# Patient Record
Sex: Male | Born: 1980 | Race: White | Hispanic: No | Marital: Single | Smoking: Current every day smoker
Health system: Southern US, Community
[De-identification: ages and names within clinical notes are randomized; demographics above are authoritative.]

---

## 2005-03-07 ENCOUNTER — Emergency Department: Payer: Self-pay | Admitting: Emergency Medicine

## 2009-09-05 ENCOUNTER — Emergency Department: Payer: Self-pay | Admitting: Emergency Medicine

## 2010-10-20 ENCOUNTER — Emergency Department: Payer: Self-pay | Admitting: Emergency Medicine

## 2012-10-21 ENCOUNTER — Emergency Department: Payer: Self-pay | Admitting: Emergency Medicine

## 2013-12-14 ENCOUNTER — Ambulatory Visit: Payer: Self-pay | Admitting: Internal Medicine

## 2014-01-28 ENCOUNTER — Emergency Department: Payer: Self-pay | Admitting: Emergency Medicine

## 2014-01-28 LAB — URINALYSIS, COMPLETE
BILIRUBIN, UR: NEGATIVE
Bacteria: NONE SEEN
Blood: NEGATIVE
Glucose,UR: NEGATIVE mg/dL (ref 0–75)
Hyaline Cast: 1
Leukocyte Esterase: NEGATIVE
Nitrite: NEGATIVE
PH: 5 (ref 4.5–8.0)
Protein: NEGATIVE
Specific Gravity: 1.012 (ref 1.003–1.030)
Squamous Epithelial: <1

## 2014-01-28 LAB — COMPREHENSIVE METABOLIC PANEL
ALBUMIN: 4.3 g/dL (ref 3.4–5.0)
ANION GAP: 10 (ref 7–16)
Alkaline Phosphatase: 81 U/L
BUN: 12 mg/dL (ref 7–18)
Bilirubin,Total: 0.3 mg/dL (ref 0.2–1.0)
CREATININE: 1.23 mg/dL (ref 0.60–1.30)
Calcium, Total: 8.8 mg/dL (ref 8.5–10.1)
Chloride: 104 mmol/L (ref 98–107)
Co2: 23 mmol/L (ref 21–32)
EGFR (African American): >60
Glucose: 120 mg/dL — ABNORMAL HIGH (ref 65–99)
OSMOLALITY: 275 (ref 275–301)
POTASSIUM: 3.7 mmol/L (ref 3.5–5.1)
SGOT(AST): 32 U/L (ref 15–37)
SGPT (ALT): 23 U/L (ref 12–78)
Sodium: 137 mmol/L (ref 136–145)
Total Protein: 7.4 g/dL (ref 6.4–8.2)

## 2014-01-28 LAB — DRUG SCREEN, URINE

## 2014-01-28 LAB — ETHANOL
ETHANOL %: 0.01 % (ref 0.000–0.080)
Ethanol: 10 mg/dL

## 2014-01-28 LAB — SALICYLATE LEVEL: Salicylates, Serum: 2 mg/dL

## 2014-01-28 LAB — CBC
HCT: 44.1 % (ref 40.0–52.0)
HGB: 14.4 g/dL (ref 13.0–18.0)
MCH: 31.5 pg (ref 26.0–34.0)
MCHC: 32.8 g/dL (ref 32.0–36.0)
MCV: 96 fL (ref 80–100)
Platelet: 290 10*3/uL (ref 150–440)
RBC: 4.58 10*6/uL (ref 4.40–5.90)
RDW: 13.4 % (ref 11.5–14.5)
WBC: 13.1 10*3/uL — ABNORMAL HIGH (ref 3.8–10.6)

## 2014-01-28 LAB — ACETAMINOPHEN LEVEL

## 2015-04-17 NOTE — Consult Note (Signed)
PATIENT NAME:  Jerome Hudson, Jerome Hudson MR#:  [id number] DATE OF BIRTH:  07-27-1981  DATE OF CONSULTATION:  01/29/2014  REFERRING PHYSICIAN:  Janalyn Harder, MD   CONSULTING PHYSICIAN:  Ardeen Fillers. Garnetta Buddy, MD  REASON FOR CONSULTATION:  "I want to go home".   HISTORY OF PRESENT ILLNESS:  The patient is a 34 year old Caucasian male who presented to the ED by the EMS as he was unresponsive at home and he drank  rubbing alcohol. He reported that he was having a conflict with his girlfriend and he was at his parents' home. He broke up with his girlfriend yesterday. He stated that he was very upset and he does not remember the events which led to his admission. He stated that he was not trying to kill himself. He reported that he was having this  relationship off and on for the past 3 years. He stated that he was having arguments with his girlfriend as he was not working and he got fired from his job recently. He was staying home most of the time and was trying to work from home. She is currently working. The patient stated that he was living with her, but just came to his parents' home yesterday. The patient reported that he thinks that the man should work. He was usually arguing with her after she will come back from work. He was angry with her and then started arguing. She would not argue with him. The patient stated that she has just called him and told him that she loves him. The patient stated that he was not trying to hurt himself, but he was trying to drink the rubbing alcohol as he was very upset and agitated. He currently denied having any suicidal ideations or plans.   PAST PSYCHIATRIC HISTORY:  The patient reported that he does not use drugs or alcohol. He reported that he has no previous psychiatric admission. He does not feel depressed, anxious, and angry at this time.   PAST MEDICAL HISTORY:  He reported that he does not have any medical issues.   ALLERGIES:  No known drug allergies.   PSYCHIATRIC ILLNESS  IN THE FAMILY:  The patient reported there is no history of psychiatric illness in his family members. There is no history of suicide in his family.   SOCIAL HISTORY:  The patient reported that he has a 98-year-old child from his previous relationship. He has a current relationship for the past 3 years. He reported that they live together. His parents live close by. He denied having any pending legal charges.   CURRENT MEDICATIONS:  None.   MEDICAL HISTORY:  A history of genital herpes. A history of a femur fracture repair on the left side. A history of back surgery.   ANCILLARY DATA:  Temperature 98.1, pulse 96, respirations 20, blood pressure 112/54.  LABORATORY, DIAGNOSTIC, AND RADIOLOGICAL DATA:  Glucose 120, BUN 12, creatinine 1.23, sodium 137, potassium 3.7, chloride 104, bicarbonate 23, anion gap 10, osmolality 275, calcium 8.81. Blood alcohol level 10. Protein 7.4, albumin 4.3, bilirubin 0.37, alkaline phosphatase 81, AST 32, ALT 20. UDS negative. WBC 13.1, RBC 4.58, hemoglobin 14.4, MCV 96, RDW 13.4.  REVIEW OF SYSTEMS: CONSTITUTIONAL:  Denies any fever or chills. No weight changes.  EYES:  No double or blurred vision.  RESPIRATORY:  No shortness of breath or cough.  CARDIOVASCULAR:  No chest pain or orthopnea.  GASTROINTESTINAL:  No abdominal pain, nausea, vomiting or diarrhea.  GENITOURINARY:  No incontinence or frequency.  ENDOCRINE:  No heat or cold intolerance.  LYMPHATIC:  No anemia or easy bruising.  INTEGUMENTARY:  No acne or rash.  MUSCULOSKELETAL:  Denies any muscle or joint pain.  NEUROLOGIC:  No tingling or weakness.   MENTAL STATUS EXAMINATION:  The patient is a moderately built male who was lying in the bed. He was awake, alert, oriented to time, place and person. Attention span normal. Concentration within normal limits. Recent and remote memory intact. Language was normal. Fund of knowledge appeared appropriate. Speech was normal. Mood was fine. Affect was congruent.  Thought process was logical, goal-directed. Thought content was nondelusional. No loosening of associations were noted. Thought content:  Did not have any suicidal or homicidal ideation or plans. Demonstrated fair insight and judgment.    DIAGNOSTIC IMPRESSION:  AXIS I: Mood disorder.  AXIS II: None noted.  AXIS III: Denied having any medical problems.   TREATMENT PLAN:  1.  Discussed with the patient about the medications, treatment, risks, benefits and alternatives.  2.  I will discontinue the involuntary commitment as the patient contracted for safety and does not have any thoughts to harm himself. He will be discharged in a stable condition. No medications dispensed at this time.   ____________________________ Ardeen FillersUzma S. Garnetta BuddyFaheem, MD usf:jm D: 01/29/2014 15:20:36 ET T: 01/29/2014 15:51:32 ET JOB#: [id number]  cc: Ardeen FillersUzma S. Garnetta BuddyFaheem, MD, <Dictator> Rhunette CroftUZMA S Mancil Pfenning MD ELECTRONICALLY SIGNED 02/09/2014 9:03

## 2020-07-07 ENCOUNTER — Emergency Department: Payer: Self-pay

## 2020-07-07 ENCOUNTER — Emergency Department
Admission: EM | Admit: 2020-07-07 | Discharge: 2020-07-07 | Disposition: A | Discharge status: Home / Self Care | Payer: Self-pay | Attending: Emergency Medicine | Admitting: Emergency Medicine

## 2020-07-07 DIAGNOSIS — T50901A Poisoning by unspecified drugs, medicaments and biological substances, accidental (unintentional), initial encounter: Secondary | ICD-10-CM | POA: Insufficient documentation

## 2020-07-07 LAB — CBC WITH DIFFERENTIAL/PLATELET
Abs Immature Granulocytes: 0.05 10*3/uL (ref 0.00–0.07)
Basophils Absolute: 0.1 10*3/uL (ref 0.0–0.1)
Basophils Relative: 1 %
Eosinophils Absolute: 0.1 10*3/uL (ref 0.0–0.5)
Eosinophils Relative: 1 %
HCT: 43.6 % (ref 39.0–52.0)
Hemoglobin: 14.5 g/dL (ref 13.0–17.0)
Immature Granulocytes: 1 %
Lymphocytes Relative: 10 %
Lymphs Abs: 0.9 10*3/uL (ref 0.7–4.0)
MCH: 30.5 pg (ref 26.0–34.0)
MCHC: 33.3 g/dL (ref 30.0–36.0)
MCV: 91.8 fL (ref 80.0–100.0)
Monocytes Absolute: 0.8 10*3/uL (ref 0.1–1.0)
Monocytes Relative: 9 %
Neutro Abs: 7 10*3/uL (ref 1.7–7.7)
Neutrophils Relative %: 78 %
Platelets: 300 10*3/uL (ref 150–400)
RBC: 4.75 MIL/uL (ref 4.22–5.81)
RDW: 12.9 % (ref 11.5–15.5)
WBC: 8.9 10*3/uL (ref 4.0–10.5)
nRBC: 0 % (ref 0.0–0.2)

## 2020-07-07 LAB — COMPREHENSIVE METABOLIC PANEL
ALT: 78 U/L — ABNORMAL HIGH (ref 0–44)
AST: 58 U/L — ABNORMAL HIGH (ref 15–41)
Albumin: 4.2 g/dL (ref 3.5–5.0)
Alkaline Phosphatase: 56 U/L (ref 38–126)
Anion gap: 11 (ref 5–15)
BUN: 8 mg/dL (ref 6–20)
CO2: 25 mmol/L (ref 22–32)
Calcium: 8.4 mg/dL — ABNORMAL LOW (ref 8.9–10.3)
Chloride: 105 mmol/L (ref 98–111)
Creatinine, Ser: 1.19 mg/dL (ref 0.61–1.24)
GFR calc Af Amer: >60 mL/min (ref >60)
GFR calc non Af Amer: >60 mL/min (ref >60)
Glucose, Bld: 80 mg/dL (ref 70–99)
Potassium: 3.7 mmol/L (ref 3.5–5.1)
Sodium: 141 mmol/L (ref 135–145)
Total Bilirubin: 0.6 mg/dL (ref 0.3–1.2)
Total Protein: 7.3 g/dL (ref 6.5–8.1)

## 2020-07-07 LAB — ETHANOL: Alcohol, Ethyl (B): 20 mg/dL — ABNORMAL HIGH (ref <10)

## 2020-07-07 LAB — SALICYLATE LEVEL: Salicylate Lvl: <7 mg/dL — ABNORMAL LOW (ref 7.0–30.0)

## 2020-07-07 LAB — ACETAMINOPHEN LEVEL: Acetaminophen (Tylenol), Serum: <10 ug/mL — ABNORMAL LOW (ref 10–30)

## 2020-07-07 MED ORDER — SODIUM CHLORIDE 0.9 % IV BOLUS
1000.0000 mL | Freq: Once | INTRAVENOUS | Status: AC
  Administered 2020-07-07: 1000 mL via INTRAVENOUS

## 2020-07-07 MED ORDER — LORAZEPAM 2 MG/ML IJ SOLN
1.0000 mg | Freq: Once | INTRAMUSCULAR | Status: AC
  Administered 2020-07-07: 1 mg via INTRAVENOUS
  Filled 2020-07-07: qty 1

## 2020-07-07 MED ORDER — HALOPERIDOL LACTATE 5 MG/ML IJ SOLN
5.0000 mg | Freq: Once | INTRAMUSCULAR | Status: DC
  Filled 2020-07-07: qty 1

## 2020-07-07 MED ORDER — LORAZEPAM 2 MG/ML IJ SOLN
2.0000 mg | Freq: Once | INTRAMUSCULAR | Status: DC
  Filled 2020-07-07: qty 1

## 2020-07-07 MED ORDER — DIPHENHYDRAMINE HCL 50 MG/ML IJ SOLN
25.0000 mg | Freq: Once | INTRAMUSCULAR | Status: DC
  Filled 2020-07-07: qty 1
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
## 2020-07-07 NOTE — ED Notes (Signed)
Pt visualized in NAD at this time, resting in bed with eyes closed, respirations even and unlabored. VSS. Lights remain dimmed for patient comfort.
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
## 2020-07-07 NOTE — ED Notes (Signed)
Pt sitting on edge of bed but continues to refuse care at this time.

## 2020-07-07 NOTE — ED Notes (Addendum)
Multiple staff and Health visitor to bedside to update patient on procedure. Pt willing to have blood draw for legal blood draw after being read his rights.   Right hand cleaned with betadine and allowed to dry. Tourniquet applied and blood sample sample drawn with blood draw needle. Tubes were filled completely and not shaken. Given then to the [employer] officer and RN observed them being sealed.

## 2020-07-07 NOTE — ED Notes (Signed)
Pt continues to attempt to call for safe ride at this time.

## 2020-07-07 NOTE — ED Notes (Signed)
Pt resting in bed laying on L side at this time. VSS. Lights remain dimmed for patient comfort.

## 2020-07-07 NOTE — ED Notes (Signed)
NAD noted at time of D/C. Pt denies questions or concerns. Pt ambulatory to the lobby at this time. EDP aware of patient's HR at time of D/C. Pt asymptomatic, per EDP pt okay for D/C. This RN notified by front desk staff pt's ride arrived to pick patient up.

## 2020-07-07 NOTE — ED Notes (Signed)
Pt sitting up in bed drinking water, attempting to call for ride, provided with urinal by this RN.

## 2020-07-07 NOTE — ED Provider Notes (Signed)
Patient is in no acute distress, is cleared for outpatient follow-up.   Emily Filbert, MD 07/07/20 1059

## 2020-07-07 NOTE — ED Notes (Signed)
After talking with [employer] and this RN pt has become more agreeable. Tearful about loss of mother and getting into trouble tonight but agrees to allow staff to perform all tasks needed to get through the night to see the psychiatrist in the morning.
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
## 2020-07-07 NOTE — ED Notes (Signed)
Pt continues to rest in bed with NAD noted. VSS at this time. Lights remain dimmed for patient comfort.

## 2020-07-07 NOTE — ED Notes (Signed)
This RN to bedside, pt noted to be standing at end of bed. This RN assisted patient back to bed. Pt given water and phone to call for safe ride at this time.

## 2020-07-07 NOTE — ED Notes (Signed)
Pt continues resting in bed. NAD at this time.  

## 2020-07-07 NOTE — ED Notes (Signed)
RN to bedside attempting to convince patient to allow vital signs and IV. Pt continues to adamantly refuse and states, I just want to get the fuck out of here"   RN has confirmed IVC papers are finalized and security sitting outside of patient's room but pt is sleeping in bed at this time.

## 2020-07-07 NOTE — ED Notes (Signed)
Pt continues sleeping soundly. NAD noted at this time.

## 2020-07-07 NOTE — ED Notes (Signed)
Only emergency contact listed in chart was patients mother who patient reports passed away in 2023/03/06. of this year. When asked about her pt burst into tears and continued to ask for [facility].

## 2020-11-12 ENCOUNTER — Ambulatory Visit: Payer: Self-pay | Admitting: Physician Assistant

## 2020-11-12 ENCOUNTER — Other Ambulatory Visit: Payer: Self-pay

## 2020-11-12 DIAGNOSIS — Z202 Contact with and (suspected) exposure to infections with a predominantly sexual mode of transmission: Secondary | ICD-10-CM

## 2020-11-12 DIAGNOSIS — Z113 Encounter for screening for infections with a predominantly sexual mode of transmission: Secondary | ICD-10-CM

## 2020-11-12 MED ORDER — DOXYCYCLINE HYCLATE 100 MG PO TABS: 100.0000 mg | ORAL_TABLET | Freq: Two times a day (BID) | ORAL | 0 refills | Status: DC

## 2020-11-13 ENCOUNTER — Encounter: Payer: Self-pay | Admitting: Physician Assistant

## 2020-11-13 NOTE — Progress Notes (Signed)
   [facility] Department STI clinic/screening visit  Subjective:  Jerome Hudson is a 39 y.o. male being seen today for an STI screening visit. The patient reports they do not have symptoms.    Patient has the following medical conditions:  There are no problems to display for this patient.    Chief Complaint  Patient presents with  . SEXUALLY TRANSMITTED DISEASE    screening    HPI  Patient reports that he is not having any symptoms but is a contact to Chlamydia.  Denies chronic conditions and regular medicines.  States last HIV test was 1 year ago.   See flowsheet for further details and programmatic requirements.    The following portions of the patient's history were reviewed and updated as appropriate: allergies, current medications, past medical history, past social history, past surgical history and problem list.  Objective:  There were no vitals filed for this visit.  Physical Exam Constitutional:      General: He is not in acute distress.    Appearance: Normal appearance.  HENT:     Head: Normocephalic and atraumatic.  Eyes:     Conjunctiva/sclera: Conjunctivae normal.  Pulmonary:     Effort: Pulmonary effort is normal.  Skin:    General: Skin is warm and dry.  Neurological:     Mental Status: He is alert and oriented to person, place, and time.  Psychiatric:        Mood and Affect: Mood normal.        Behavior: Behavior normal.        Thought Content: Thought content normal.        Judgment: Judgment normal.       Assessment and Plan:  Jerome Hudson is a 39 y.o. male presenting to the [facility] Department for STI screening  1. Screening for STD (sexually transmitted disease) Patient into clinic without symptoms. Patient declines screening exam and testing.  Requests blood work and treatment only.  Rec condoms with all sex. Await test results.  Counseled that RN will call if needs to RTC for treatment once results are  back. - HBV Antigen/Antibody [facility] - HIV/HCV [place] [facility] - Syphilis Serology, [facility]  2. Chlamydia contact Will treat as a contact to Chlamydia with Doxycycline 100 mg #28 1 po BID for 14 days.   Counseled patient that due to packaging, will give him 14 days of medicine, but he will only need to take the medicine for 7 days. No sex for 14 days and until after partner completes treatment . Call with questions or concerns. - doxycycline (VIBRA-TABS) 100 MG tablet; Take 1 tablet (100 mg total) by mouth 2 (two) times daily.  Dispense: 14 tablet; Refill: 0     No follow-ups on file.  No future appointments.  Matt Holmes, PA

## 2020-11-21 NOTE — Progress Notes (Signed)
Chart reviewed by Pharmacist  Suzanne Walker PharmD, Contract Pharmacist at [facility]  

## 2020-11-24 LAB — HEPATITIS B SURFACE ANTIGEN: Hepatitis B Surface Ag: NEGATIVE

## 2020-12-09 ENCOUNTER — Telehealth: Payer: Self-pay

## 2020-12-09 DIAGNOSIS — K732 Chronic active hepatitis, not elsewhere classified: Secondary | ICD-10-CM | POA: Insufficient documentation

## 2021-01-20 ENCOUNTER — Other Ambulatory Visit: Payer: Self-pay

## 2021-01-20 ENCOUNTER — Encounter: Payer: Self-pay | Admitting: Emergency Medicine

## 2021-01-20 ENCOUNTER — Emergency Department: Payer: Managed Care, Other (non HMO)

## 2021-01-20 ENCOUNTER — Emergency Department
Admission: EM | Admit: 2021-01-20 | Discharge: 2021-01-20 | Disposition: A | Discharge status: Home / Self Care | Payer: Managed Care, Other (non HMO) | Attending: Physician Assistant | Admitting: Physician Assistant

## 2021-01-20 DIAGNOSIS — K920 Hematemesis: Secondary | ICD-10-CM | POA: Insufficient documentation

## 2021-01-20 DIAGNOSIS — Z5321 Procedure and treatment not carried out due to patient leaving prior to being seen by health care provider: Secondary | ICD-10-CM | POA: Diagnosis not present

## 2021-01-20 DIAGNOSIS — R1011 Right upper quadrant pain: Secondary | ICD-10-CM | POA: Diagnosis not present

## 2021-01-20 LAB — CBC WITH DIFFERENTIAL/PLATELET
Abs Immature Granulocytes: 0.02 10*3/uL (ref 0.00–0.07)
Basophils Absolute: 0.1 10*3/uL (ref 0.0–0.1)
Basophils Relative: 1 %
Eosinophils Absolute: 0.2 10*3/uL (ref 0.0–0.5)
Eosinophils Relative: 3 %
HCT: 45.4 % (ref 39.0–52.0)
Hemoglobin: 15.7 g/dL (ref 13.0–17.0)
Immature Granulocytes: 0 %
Lymphocytes Relative: 30 %
Lymphs Abs: 2.3 10*3/uL (ref 0.7–4.0)
MCH: 32.4 pg (ref 26.0–34.0)
MCHC: 34.6 g/dL (ref 30.0–36.0)
MCV: 93.6 fL (ref 80.0–100.0)
Monocytes Absolute: 0.8 10*3/uL (ref 0.1–1.0)
Monocytes Relative: 10 %
Neutro Abs: 4.3 10*3/uL (ref 1.7–7.7)
Neutrophils Relative %: 56 %
Platelets: 347 10*3/uL (ref 150–400)
RBC: 4.85 MIL/uL (ref 4.22–5.81)
RDW: 12.7 % (ref 11.5–15.5)
WBC: 7.7 10*3/uL (ref 4.0–10.5)
nRBC: 0 % (ref 0.0–0.2)

## 2021-01-20 LAB — COMPREHENSIVE METABOLIC PANEL
ALT: 105 U/L — ABNORMAL HIGH (ref 0–44)
AST: 43 U/L — ABNORMAL HIGH (ref 15–41)
Albumin: 4.3 g/dL (ref 3.5–5.0)
Alkaline Phosphatase: 65 U/L (ref 38–126)
Anion gap: 9 (ref 5–15)
BUN: 12 mg/dL (ref 6–20)
CO2: 24 mmol/L (ref 22–32)
Calcium: 9 mg/dL (ref 8.9–10.3)
Chloride: 109 mmol/L (ref 98–111)
Creatinine, Ser: 1.02 mg/dL (ref 0.61–1.24)
GFR, Estimated: >60 mL/min (ref >60)
Glucose, Bld: 114 mg/dL — ABNORMAL HIGH (ref 70–99)
Potassium: 4 mmol/L (ref 3.5–5.1)
Sodium: 142 mmol/L (ref 135–145)
Total Bilirubin: 0.5 mg/dL (ref 0.3–1.2)
Total Protein: 7.5 g/dL (ref 6.5–8.1)

## 2021-01-20 LAB — LIPASE, BLOOD: Lipase: 45 U/L (ref 11–51)

## 2021-01-20 NOTE — ED Notes (Signed)
Called 2n time no answer

## 2021-01-20 NOTE — ED Notes (Signed)
Called 1X with no answer  

## 2021-01-20 NOTE — ED Provider Notes (Signed)
MSE was initiated and I personally evaluated the patient and placed orders (if any) at  1:25 PM on January 20, 2021.  The patient appears stable so that the remainder of the MSE may be completed by another provider.  Hx meth use, vomiting and diarrhea, red sreaks of blood in vomit, some ruq pain, still has gb  Labs and [place] ordered   Faythe Ghee, PA-C 01/20/21 1326    Willy Eddy, MD 01/20/21 1329

## 2021-01-20 NOTE — ED Notes (Signed)
Called no answer

## 2021-01-20 NOTE — ED Triage Notes (Signed)
Pt with hematemesis that started yesterday. Pt reports emesis x 3 today with strands of blood.

## 2021-02-24 NOTE — Telephone Encounter (Signed)
Patient info faxed to [facility], RN

## 2021-06-26 ENCOUNTER — Other Ambulatory Visit: Payer: Self-pay

## 2021-06-26 ENCOUNTER — Emergency Department: Payer: Medicaid Other

## 2021-06-26 ENCOUNTER — Emergency Department
Admission: EM | Admit: 2021-06-26 | Discharge: 2021-06-27 | Disposition: A | Discharge status: Home / Self Care | Payer: Medicaid Other | Attending: Emergency Medicine | Admitting: Emergency Medicine

## 2021-06-26 ENCOUNTER — Encounter: Payer: Self-pay | Admitting: Emergency Medicine

## 2021-06-26 DIAGNOSIS — R410 Disorientation, unspecified: Secondary | ICD-10-CM | POA: Insufficient documentation

## 2021-06-26 DIAGNOSIS — F17210 Nicotine dependence, cigarettes, uncomplicated: Secondary | ICD-10-CM | POA: Insufficient documentation

## 2021-06-26 DIAGNOSIS — Y901 Blood alcohol level of 20-39 mg/100 ml: Secondary | ICD-10-CM | POA: Insufficient documentation

## 2021-06-26 DIAGNOSIS — F10129 Alcohol abuse with intoxication, unspecified: Secondary | ICD-10-CM | POA: Insufficient documentation

## 2021-06-26 DIAGNOSIS — R451 Restlessness and agitation: Secondary | ICD-10-CM

## 2021-06-26 LAB — BLOOD GAS, VENOUS
Acid-Base Excess: 0.2 mmol/L (ref 0.0–2.0)
Bicarbonate: 27.7 mmol/L (ref 20.0–28.0)
O2 Saturation: 87.5 %
Patient temperature: 37
pCO2, Ven: 55 mmHg (ref 44.0–60.0)
pH, Ven: 7.31 (ref 7.250–7.430)
pO2, Ven: 59 mmHg — ABNORMAL HIGH (ref 32.0–45.0)

## 2021-06-26 LAB — CBC WITH DIFFERENTIAL/PLATELET
Abs Immature Granulocytes: 0.01 10*3/uL (ref 0.00–0.07)
Basophils Absolute: 0.1 10*3/uL (ref 0.0–0.1)
Basophils Relative: 1 %
Eosinophils Absolute: 0.2 10*3/uL (ref 0.0–0.5)
Eosinophils Relative: 3 %
HCT: 46.1 % (ref 39.0–52.0)
Hemoglobin: 15.6 g/dL (ref 13.0–17.0)
Immature Granulocytes: 0 %
Lymphocytes Relative: 40 %
Lymphs Abs: 3.1 10*3/uL (ref 0.7–4.0)
MCH: 31.3 pg (ref 26.0–34.0)
MCHC: 33.8 g/dL (ref 30.0–36.0)
MCV: 92.4 fL (ref 80.0–100.0)
Monocytes Absolute: 0.7 10*3/uL (ref 0.1–1.0)
Monocytes Relative: 10 %
Neutro Abs: 3.6 10*3/uL (ref 1.7–7.7)
Neutrophils Relative %: 46 %
Platelets: 322 10*3/uL (ref 150–400)
RBC: 4.99 MIL/uL (ref 4.22–5.81)
RDW: 12.6 % (ref 11.5–15.5)
WBC: 7.8 10*3/uL (ref 4.0–10.5)
nRBC: 0 % (ref 0.0–0.2)

## 2021-06-26 LAB — SALICYLATE LEVEL: Salicylate Lvl: <7 mg/dL — ABNORMAL LOW (ref 7.0–30.0)

## 2021-06-26 LAB — ETHANOL: Alcohol, Ethyl (B): 35 mg/dL — ABNORMAL HIGH (ref <10)

## 2021-06-26 LAB — COMPREHENSIVE METABOLIC PANEL
ALT: 83 U/L — ABNORMAL HIGH (ref 0–44)
AST: 46 U/L — ABNORMAL HIGH (ref 15–41)
Albumin: 4.3 g/dL (ref 3.5–5.0)
Alkaline Phosphatase: 64 U/L (ref 38–126)
Anion gap: 11 (ref 5–15)
BUN: 12 mg/dL (ref 6–20)
CO2: 24 mmol/L (ref 22–32)
Calcium: 8.9 mg/dL (ref 8.9–10.3)
Chloride: 108 mmol/L (ref 98–111)
Creatinine, Ser: 1.16 mg/dL (ref 0.61–1.24)
GFR, Estimated: >60 mL/min (ref >60)
Glucose, Bld: 97 mg/dL (ref 70–99)
Potassium: 3.4 mmol/L — ABNORMAL LOW (ref 3.5–5.1)
Sodium: 143 mmol/L (ref 135–145)
Total Bilirubin: 0.6 mg/dL (ref 0.3–1.2)
Total Protein: 7.6 g/dL (ref 6.5–8.1)

## 2021-06-26 LAB — AMMONIA: Ammonia: 35 umol/L (ref 9–35)

## 2021-06-26 LAB — ACETAMINOPHEN LEVEL: Acetaminophen (Tylenol), Serum: <10 ug/mL — ABNORMAL LOW (ref 10–30)

## 2021-06-26 MED ORDER — SODIUM CHLORIDE 0.9 % IV BOLUS
1000.0000 mL | Freq: Once | INTRAVENOUS | Status: AC
  Administered 2021-06-26: 20:00:00 1000 mL via INTRAVENOUS
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
## 2021-06-26 NOTE — ED Provider Notes (Signed)
[facility] Emergency Department Provider Note  ____________________________________________   Event Date/Time   First MD Initiated Contact with Patient 06/26/21 1933     (approximate)  I have reviewed the triage vital signs and the nursing notes.   HISTORY  Chief Complaint Altered Mental Status and Alcohol Intoxication    HPI Jerome Hudson is a 40 y.o. male with chronic hepatitis here with altered mental status.  The patient arrives via EMS.  Per report, he was found in the alcohol aisle at [employer].  He was initially minimally responsive.  With EMS, an IV was placed and the patient became very combative.  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  He required 2.5 of Versed in route and has been sleeping since then.  Patient confused, drowsy, unable to provide history on my assessment.  Level 5 caveat invoked as remainder of history, ROS, and physical exam limited due to patient's AMS.     History reviewed. No pertinent past medical history.  Patient Active Problem List   Diagnosis Date Noted   Chronic active hepatitis (HCC) 12/09/2020    History reviewed. No pertinent surgical history.  Prior to Admission medications   Not on File    Allergies Patient has no known allergies.  History reviewed. No pertinent family history.  Social History Social History   Tobacco Use   Smoking status: Every Day    Pack years: 0.00    Types: Cigarettes   Smokeless tobacco: Never  Substance Use Topics   Alcohol use: Yes    Comment: occasionally   Drug use: Not Currently    Types: Marijuana, Cocaine, IV, Methamphetamines, "Crack" cocaine, Heroin    Comment: per patient clean for several years.    Review of Systems  Review of Systems  Unable to perform ROS: Mental status change    ____________________________________________  PHYSICAL EXAM:      VITAL SIGNS: ED Triage Vitals  Enc Vitals Group     BP 06/26/21 1947 130/69     Pulse Rate 06/26/21  1947 (!) 103     Resp 06/26/21 1947 16     Temp --      Temp src --      SpO2 06/26/21 1947 91 %     Weight 06/26/21 1940 169 lb 15.6 oz (77.1 kg)     Height 06/26/21 1940 5\' 10"  (1.778 m)     Head Circumference --      Peak Flow --      Pain Score 06/26/21 1940 Asleep     Pain Loc --      Pain Edu? --      Excl. in GC? --      Physical Exam    ____________________________________________   LABS (all labs ordered are listed, but only abnormal results are displayed)  Labs Reviewed  COMPREHENSIVE METABOLIC PANEL - Abnormal; Notable for the following components:      Result Value   Potassium 3.4 (*)    AST 46 (*)    ALT 83 (*)    All other components within normal limits  ETHANOL - Abnormal; Notable for the following components:   Alcohol, Ethyl (B) 35 (*)    All other components within normal limits  ACETAMINOPHEN LEVEL - Abnormal; Notable for the following components:   Acetaminophen (Tylenol), Serum <10 (*)    All other components within normal limits  SALICYLATE LEVEL - Abnormal; Notable for the following components:   Salicylate Lvl <7.0 (*)  All other components within normal limits  BLOOD GAS, VENOUS - Abnormal; Notable for the following components:   pO2, Ven 59.0 (*)    All other components within normal limits  CBC WITH DIFFERENTIAL/PLATELET  AMMONIA  URINE DRUG SCREEN, QUALITATIVE ([facility] ONLY)    ____________________________________________  EKG:  ________________________________________  RADIOLOGY All imaging, including plain films, CT scans, and ultrasounds, independently reviewed by me, and interpretations confirmed via formal radiology reads.  ED MD interpretation:   CT Head: NAICA CXR: low lung volumes, no PNA  Official radiology report(s): CT Head Wo Contrast  Result Date: 06/26/2021 CLINICAL DATA:  Mental status change of unknown cause. EXAM: CT HEAD WITHOUT CONTRAST TECHNIQUE: Contiguous axial images were obtained from the base of the  skull through the vertex without intravenous contrast. COMPARISON:  03/07/2005 FINDINGS: Brain: No evidence of acute infarction, hemorrhage, hydrocephalus, extra-axial collection or mass lesion/mass effect. Vascular: No hyperdense vessel or unexpected calcification. Skull: Normal. Negative for fracture or focal lesion. Sinuses/Orbits: Paranasal sinuses and mastoid air cells are clear. Other: No significant changes since prior study. IMPRESSION: No acute intracranial abnormalities. Electronically Signed   By: Burman Nieves M.D.   On: 06/26/2021 21:13   DG Chest Portable 1 View  Result Date: 06/26/2021 CLINICAL DATA:  Shortness of breath. Altered level of consciousness. Intoxicated. EXAM: PORTABLE CHEST 1 VIEW COMPARISON:  Chest radiograph 07/07/2020 FINDINGS: Low lung volumes limit assessment. Normal heart size for technique. Bibasilar atelectasis. No confluent airspace disease, pulmonary edema, pleural effusion or pneumothorax. No acute osseous abnormalities are seen. IMPRESSION: Low lung volumes with bibasilar atelectasis. Electronically Signed   By: Narda Rutherford M.D.   On: 06/26/2021 20:45    ____________________________________________  PROCEDURES   Procedure(s) performed (including Critical Care):  Procedures  ____________________________________________  INITIAL IMPRESSION / MDM / ASSESSMENT AND PLAN / ED COURSE  As part of my medical decision making, I reviewed the following data within the electronic MEDICAL RECORD NUMBER Nursing notes reviewed and incorporated, Old chart reviewed, Notes from prior ED visits, and [place] Controlled Substance Database       *[name] was evaluated in Emergency Department on 06/27/2021 for the symptoms described in the history of present illness. He was evaluated in the context of the global COVID-19 pandemic, which necessitated consideration that the patient might be at risk for infection with the SARS-CoV-2 virus that causes COVID-19. Institutional  protocols and algorithms that pertain to the evaluation of patients at risk for COVID-19 are in a state of rapid change based on information released by regulatory bodies including the CDC and federal and state organizations. These policies and algorithms were followed during the patient's care in the ED.  Some ED evaluations and interventions may be delayed as a result of limited staffing during the pandemic.*     Medical Decision Making:  40 yo M here with AMS. History of substance abuse and suspect acute intoxication, though history limited on arrival. No apparent trauma on exam. Pt drowsy after versed on arrival but protecting airway. Labs reviewed as above, and are overall reassuring. No significant hypercapia. EtOH only minimally elevated - suspect polysubstance use. CBC, CMp unremarkable. Ammonia is wnl. CT head shows NAICA. CXR unremarkable, no focal PNA.   Pt increasingly awake in ED. Will monitor for return to sobriety, reassess. Suspect encephalopathy 2/2 substance abuse. No signs to suggest seizure, stroke, metabolic encephalopathy, or infectious etiology.  ____________________________________________  FINAL CLINICAL IMPRESSION(S) / ED DIAGNOSES  Final diagnoses:  Disorientation     MEDICATIONS  GIVEN DURING THIS VISIT:  Medications  sodium chloride 0.9 % bolus 1,000 mL (0 mLs Intravenous Stopped 06/26/21 2230)     ED Discharge Orders     None        Note:  This document was prepared using Dragon voice recognition software and may include unintentional dictation errors.   Shaune Pollack, MD 06/27/21 [phone]

## 2021-06-26 NOTE — ED Notes (Signed)
Pt. Sleeping in room, cardiac bedside monitoring continued.
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
## 2021-06-27 NOTE — ED Notes (Signed)
Pt resting in bed, eyes closed, chest rise and fall.

## 2021-06-27 NOTE — ED Notes (Signed)
Pt a/o, states that he feels ok to discharge and that he will walk to a friends house that is close to here. Pt inquired about a car key that was in his pocket prior to being brought here by ems. Pt and I searched room, under stretcher, removed sheets and shook them but no key found. Pt given ems non-emergent phone number to call and see if key fell out in transport. See triage note for more info.

## 2021-08-01 ENCOUNTER — Emergency Department: Payer: Medicaid Other

## 2021-08-01 ENCOUNTER — Emergency Department
Admission: EM | Admit: 2021-08-01 | Discharge: 2021-08-02 | Disposition: A | Discharge status: Home / Self Care | Payer: Medicaid Other | Attending: Emergency Medicine | Admitting: Emergency Medicine

## 2021-08-01 ENCOUNTER — Other Ambulatory Visit: Payer: Self-pay

## 2021-08-01 DIAGNOSIS — Y92149 Unspecified place in prison as the place of occurrence of the external cause: Secondary | ICD-10-CM | POA: Insufficient documentation

## 2021-08-01 DIAGNOSIS — S61419A Laceration without foreign body of unspecified hand, initial encounter: Secondary | ICD-10-CM

## 2021-08-01 DIAGNOSIS — F17210 Nicotine dependence, cigarettes, uncomplicated: Secondary | ICD-10-CM | POA: Insufficient documentation

## 2021-08-01 DIAGNOSIS — M546 Pain in thoracic spine: Secondary | ICD-10-CM | POA: Insufficient documentation

## 2021-08-01 DIAGNOSIS — S0101XA Laceration without foreign body of scalp, initial encounter: Secondary | ICD-10-CM | POA: Insufficient documentation

## 2021-08-01 DIAGNOSIS — M542 Cervicalgia: Secondary | ICD-10-CM | POA: Insufficient documentation

## 2021-08-01 MED ORDER — CEPHALEXIN 500 MG PO CAPS: 1000.0000 mg | ORAL_CAPSULE | Freq: Two times a day (BID) | ORAL | 0 refills | Status: AC

## 2021-08-01 MED ORDER — LIDOCAINE-EPINEPHRINE (PF) 2 %-1:200000 IJ SOLN
10.0000 mL | Freq: Once | INTRAMUSCULAR | Status: AC
  Administered 2021-08-01: 10 mL
  Filled 2021-08-01: qty 10

## 2021-08-01 MED ORDER — IBUPROFEN 600 MG PO TABS
600.0000 mg | ORAL_TABLET | Freq: Once | ORAL | Status: AC
  Administered 2021-08-01: 600 mg via ORAL
  Filled 2021-08-01: qty 1

## 2021-08-01 NOTE — Discharge Instructions (Addendum)
Patient sustained a laceration to the scalp which is closed with sutures.  Sutures may be removed in 7 to 10 days based off of wound healing.  Area may be wet as far as shower but no full submersion.  Do not apply topicals.  Clean only with soap and water.  Will be on oral antibiotics prophylactically.  Tylenol and/or Motrin may be administered as needed for any headache or neck and back pain.  There was no acute traumatic findings on imaging.  Patient may have 1000 mg of Tylenol every 6 hours as needed for pain.  He may have 800 mg of ibuprofen every 6 hours as needed for pain.  Patient may take both at the same time if needed.
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
